# Patient Record
Sex: Female | Born: 1985
Health system: Southern US, Community
[De-identification: ages and names within clinical notes are randomized; demographics above are authoritative.]

## PROBLEM LIST (undated history)

## (undated) DIAGNOSIS — F329 Major depressive disorder, single episode, unspecified: Secondary | ICD-10-CM

## (undated) DIAGNOSIS — F32A Depression, unspecified: Secondary | ICD-10-CM

## (undated) DIAGNOSIS — M549 Dorsalgia, unspecified: Secondary | ICD-10-CM

## (undated) HISTORY — PX: APPENDECTOMY: SHX54

---
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
## 2005-01-29 ENCOUNTER — Ambulatory Visit: Payer: Self-pay | Admitting: Family Medicine

## 2005-03-02 ENCOUNTER — Ambulatory Visit: Payer: Self-pay | Admitting: Family Medicine

## 2005-03-08 ENCOUNTER — Ambulatory Visit: Payer: Self-pay | Admitting: Family Medicine

## 2005-03-16 ENCOUNTER — Ambulatory Visit: Payer: Self-pay | Admitting: Family Medicine

## 2005-06-22 ENCOUNTER — Ambulatory Visit: Payer: Self-pay | Admitting: Family Medicine

## 2006-11-25 ENCOUNTER — Emergency Department (HOSPITAL_COMMUNITY): Admission: EM | Admit: 2006-11-25 | Discharge: 2006-11-25 | Payer: Self-pay | Admitting: Emergency Medicine

## 2007-08-17 ENCOUNTER — Emergency Department (HOSPITAL_COMMUNITY): Admission: EM | Admit: 2007-08-17 | Discharge: 2007-08-17 | Payer: Self-pay | Admitting: Emergency Medicine

## 2008-11-24 ENCOUNTER — Inpatient Hospital Stay (HOSPITAL_COMMUNITY): Admission: AD | Admit: 2008-11-24 | Discharge: 2008-11-28 | Payer: Self-pay | Admitting: Obstetrics & Gynecology

## 2009-11-04 ENCOUNTER — Encounter (INDEPENDENT_AMBULATORY_CARE_PROVIDER_SITE_OTHER): Payer: Self-pay | Admitting: *Deleted

## 2010-10-26 NOTE — Letter (Signed)
Summary: New Patient letter  [facility]  [address] [place], [place] [address]   Phone: [phone]  Fax: [phone]       11/04/2009 MRN: [id number]  Ruth Cannon [address] [id number] [place], [place]  [address]  Dear Ruth Cannon,  Welcome to the Gastroenterology Division at [facility].    You are scheduled to see Dr.  Arlyce Dice on 12-02-09 at 3:30pm on the 3rd floor at [facility], [address]  We ask that you try to arrive at our office 15 minutes prior to your appointment time to allow for check-in.  We would like you to complete the enclosed self-administered evaluation form prior to your visit and bring it with you on the day of your appointment.  We will review it with you.  Also, please bring a complete list of all your medications or, if you prefer, bring the medication bottles and we will list them.  Please bring your insurance card so that we may make a copy of it.  If your insurance requires a referral to see a specialist, please bring your referral form from your primary care physician.  Co-payments are due at the time of your visit and may be paid by cash, check or credit card.     Your office visit will consist of a consult with your physician (includes a physical exam), any laboratory testing he/she may order, scheduling of any necessary diagnostic testing (e.g. x-ray, ultrasound, CT-scan), and scheduling of a procedure (e.g. Endoscopy, Colonoscopy) if required.  Please allow enough time on your schedule to allow for any/all of these possibilities.    If you cannot keep your appointment, please call [phone] to cancel or reschedule prior to your appointment date.  This allows [place] the opportunity to schedule an appointment for another patient in need of care.  If you do not cancel or reschedule by 5 p.m. the business day prior to your appointment date, you will be charged a $50.00 late cancellation/no-show fee.    Thank you for  choosing [facility] for your medical needs.  We appreciate the opportunity to care for you.  Please visit [place] at our website  to learn more about our practice.                     Sincerely,                                                             The Gastroenterology Division

## 2011-01-04 LAB — CBC
HCT: 30 % — ABNORMAL LOW (ref 36.0–46.0)
HCT: 40.6 % (ref 36.0–46.0)
Hemoglobin: 10.3 g/dL — ABNORMAL LOW (ref 12.0–15.0)
MCHC: 34.2 g/dL (ref 30.0–36.0)
MCHC: 34.2 g/dL (ref 30.0–36.0)
MCV: 97.8 fL (ref 78.0–100.0)
Platelets: 139 10*3/uL — ABNORMAL LOW (ref 150–400)
RBC: 3.07 MIL/uL — ABNORMAL LOW (ref 3.87–5.11)
RDW: 14 % (ref 11.5–15.5)
RDW: 14 % (ref 11.5–15.5)
WBC: 11.9 10*3/uL — ABNORMAL HIGH (ref 4.0–10.5)

## 2011-01-04 LAB — RPR: RPR Ser Ql: NONREACTIVE

## 2011-02-06 NOTE — Op Note (Signed)
NAME:  Ruth Cannon, Ruth Cannon NO.:  [id number]   MEDICAL RECORD NO.:  [id number]          PATIENT TYPE:  INP   LOCATION:  9167                          FACILITY:  [facility]   PHYSICIAN:  Gerrit Friends. Aldona Bar, M.D.   DATE OF BIRTH:  12-Mar-1986   DATE OF PROCEDURE:  DATE OF DISCHARGE:                               OPERATIVE REPORT   PREOPERATIVE DIAGNOSES:  Term pregnancy, active labor, arrest of second  stage of labor.   POSTOPERATIVE DIAGNOSES:  Term pregnancy, active labor, arrest of second  stage of labor, delivery of 10 pounds 2 ounces female infant, Apgars 9 and  9.   PROCEDURE:  Primary low-transverse cesarean section.   SURGEON:  Gerrit Friends. Aldona Bar, MD   ANESTHESIA:  Epidural.   HISTORY:  This 25 year old primigravida was admitted at 39-[redacted] weeks  gestation in labor after a relatively uncomplicated pregnancy.  She  progressed to full dilatation with the vertex remained at +1 station at  best.  After an hour and 20 minutes of pushing, the vertex was still at  +1 station and there was some holding.  The patient had an ultrasound at  37-38 weeks' gestation, which revealed the baby weighing then at least  3800-3900 grams placing at greater than 90th percentile.  She also  measured 42 cm when examined early this morning.   Because of concerns about the size of the baby associated with the  arrest of second stage of the labor, decision was made to proceed with  primary low-transverse cesarean section for delivery.   PROCEDURE IN DETAIL:  The patient was taken to the operating room where  after the augmentation of her epidural she was placed in the supine  position, slightly tilted left and prepped and draped in usual fashion.  A Foley catheter had been inserted prior to her arrival in the operating  room and indeed the urine was noted to be slightly bloody prior to  beginning the operative procedure.   Once good anesthetic levels were documented, procedure was begun.  A  Pfannenstiel incision was made with minimal difficulty, dissected down  sharply to and through the fascia in low transverse fashion with  hemostasis created each layer.  The fascia was incised in low transverse  fashion, subfascial space was developed, muscles separated in midline,  peritoneum identified and entered appropriate with care taken to avoid  the bowel superiorly and the bladder inferiorly.  At this time, the  vesicouterine peritoneum was incised in low transverse fashion and  pushed off the lower uterine segment with ease.  Sharp incision in  uterus low-transverse fashion was then made with Metzenbaum scissors and  extended laterally with fingers.  Thereafter from a vertex position a  viable female infant, which cried spontaneously once was delivered.  Apgars were 9 and 9 - baby was crying before completely delivered.   Cord was clamped and cut.  The infant was passed off the awaiting team  and subsequently taken to nursery in good condition.   Placenta was delivered intact and the patient was a cord blood donor.  The placenta was passed off and  cord bloods will be collected by the  cord blood team.   At this time, the uterus was exteriorized, rendered free of any  remaining products of conception.  Good uterine contractility was  afforded with slowly given intravenous Pitocin and manual stimulation.  At this time, closure of the uterine incision was carried out using a  single layer of #1 Vicryl in a running-locking fashion, and this was  oversewn with several figure-of-eight #1 Vicryls.   Hemostasis was adequate at this point.  Uterus was well contracted.  Tubes and ovaries appeared normal.  After the abdomen lavaged of all  free blood and clot, the uterus was replaced into the abdominal cavity.  At this time, all counts were noted to be correct and no foreign bodies  were noted to be remaining in the abdominal cavity.  Closure of the  abdomen at this time was carried out  in layers.  The abdominal  peritoneum was closed with 0 Vicryl in a running fashion and muscle  secured with same.  Assured of good subfascial hemostasis.  The fascia  was then reapproximated using 0 Vicryl from angle to midline  bilaterally.  Subcutaneous tissue was __________ hemostatic and staples  were used to close the skin.  Sterile pressure dressing was applied.  At  this time, the patient was transport to recovery room in satisfactory  condition having tolerated the procedure well.  Estimated blood loss 500  mL.  All counts correct x2.  At the conclusion of procedure, both mother  and baby were doing well in respective to recovery areas.   In summary, this patient underwent a primary low-transverse cesarean  section because of arrest disorder in second stage of labor secondary to  what was suspected to be a large baby and indeed the baby ended up  weighing 10 pounds 2 ounces.      Gerrit Friends. Aldona Bar, M.D.  Electronically Signed     RMW/MEDQ  D:  11/25/2008  T:  11/25/2008  Job:  [id number]

## 2011-02-09 NOTE — Discharge Summary (Signed)
NAME:  Ruth Cannon, Ruth Cannon NO.:  [id number]   MEDICAL RECORD NO.:  [id number]          PATIENT TYPE:  INP   LOCATION:  9111                          FACILITY:  [facility]   PHYSICIAN:  Randye Lobo, M.D.   DATE OF BIRTH:  Mar 01, 1986   DATE OF ADMISSION:  11/24/2008  DATE OF DISCHARGE:  11/28/2008                               DISCHARGE SUMMARY   FINAL DIAGNOSES:  Intrauterine pregnancy at term, active labor, arrest  of second stage of labor, macrosomia.   PROCEDURE:  Primary low transverse cesarean section.   SURGEON:  Gerrit Friends. Aldona Bar, MD   COMPLICATIONS:  None.   This 25 year old, G1, P0, presents at 39+ weeks' gestation in active  labor.  The patient's antepartum course up to this point had been  relatively uncomplicated.  She does have hypothyroidism, but was  monitored throughout the pregnancy with thyroid function tests.  The  patient did have a negative group B strep culture obtained in our office  at 36 weeks.  The patient presents now in active labor.  She progressed  to complete dilation.  The vertex was about a +1 station.  After over an  hour and 20 minutes of pushing, the vertex had not changed past a +1  station.  There was some questionable evidence of macrosomia, and  therefore a decision was made to proceed with a cesarean section  secondary to this arrest at a second stage labor.  The patient was taken  to the operating room on November 24, 2008, by Dr. Annamaria Helling where a  primary low transverse cesarean section was performed with the delivery  of a 10-pound 2-ounce female infant with Apgars of 9 and 9.  Delivery went  without complications.  The patient's postoperative course was benign  without any significant fevers.  She did want her little boy circumcised  prior to discharge which did occur.  The patient was felt ready for  discharge on postoperative day #3.  She was sent home on a regular diet,  told to decrease activities, told to continue her  prenatal vitamins, was  given Percocet to take one to two every 4-6 hours as needed for pain,  and told she could use ibuprofen up to 600 mg every 6 hours as needed  for pain.  She was to follow up in our office in 4 weeks.  Instructions  and precautions were reviewed with the patient.   LABORATORY DATA ON DISCHARGE:  The patient had a hemoglobin of 10.3,  white cell count of 11.9, and platelets of 117,000.      Leilani Able, P.A.-C.      Randye Lobo, M.D.  Electronically Signed    MB/MEDQ  D:  12/15/2008  T:  12/16/2008  Job:  [id number]

## 2012-01-02 ENCOUNTER — Encounter (HOSPITAL_COMMUNITY): Payer: Self-pay | Admitting: *Deleted

## 2012-01-02 ENCOUNTER — Inpatient Hospital Stay (HOSPITAL_COMMUNITY)
Admission: EM | Admit: 2012-01-02 | Discharge: 2012-01-06 | DRG: 917 | Disposition: A | Discharge status: Other Acute Inpatient Hospital | Payer: Medicaid Other | Attending: Pulmonary Disease | Admitting: Pulmonary Disease

## 2012-01-02 ENCOUNTER — Emergency Department (HOSPITAL_COMMUNITY): Payer: Medicaid Other

## 2012-01-02 DIAGNOSIS — T424X4A Poisoning by benzodiazepines, undetermined, initial encounter: Secondary | ICD-10-CM | POA: Diagnosis present

## 2012-01-02 DIAGNOSIS — T394X2A Poisoning by antirheumatics, not elsewhere classified, intentional self-harm, initial encounter: Secondary | ICD-10-CM | POA: Diagnosis present

## 2012-01-02 DIAGNOSIS — R4182 Altered mental status, unspecified: Secondary | ICD-10-CM | POA: Diagnosis present

## 2012-01-02 DIAGNOSIS — F1110 Opioid abuse, uncomplicated: Secondary | ICD-10-CM | POA: Diagnosis present

## 2012-01-02 DIAGNOSIS — F1020 Alcohol dependence, uncomplicated: Secondary | ICD-10-CM | POA: Diagnosis present

## 2012-01-02 DIAGNOSIS — F1310 Sedative, hypnotic or anxiolytic abuse, uncomplicated: Secondary | ICD-10-CM | POA: Diagnosis present

## 2012-01-02 DIAGNOSIS — T43502A Poisoning by unspecified antipsychotics and neuroleptics, intentional self-harm, initial encounter: Secondary | ICD-10-CM | POA: Diagnosis present

## 2012-01-02 DIAGNOSIS — T1491XA Suicide attempt, initial encounter: Secondary | ICD-10-CM

## 2012-01-02 DIAGNOSIS — J9601 Acute respiratory failure with hypoxia: Secondary | ICD-10-CM | POA: Diagnosis present

## 2012-01-02 DIAGNOSIS — E039 Hypothyroidism, unspecified: Secondary | ICD-10-CM | POA: Diagnosis present

## 2012-01-02 DIAGNOSIS — J9600 Acute respiratory failure, unspecified whether with hypoxia or hypercapnia: Secondary | ICD-10-CM | POA: Diagnosis present

## 2012-01-02 DIAGNOSIS — F17200 Nicotine dependence, unspecified, uncomplicated: Secondary | ICD-10-CM | POA: Diagnosis present

## 2012-01-02 DIAGNOSIS — T438X2A Poisoning by other psychotropic drugs, intentional self-harm, initial encounter: Secondary | ICD-10-CM | POA: Diagnosis present

## 2012-01-02 DIAGNOSIS — E876 Hypokalemia: Secondary | ICD-10-CM | POA: Diagnosis not present

## 2012-01-02 DIAGNOSIS — T400X1A Poisoning by opium, accidental (unintentional), initial encounter: Secondary | ICD-10-CM | POA: Diagnosis present

## 2012-01-02 DIAGNOSIS — T43624A Poisoning by amphetamines, undetermined, initial encounter: Principal | ICD-10-CM | POA: Diagnosis present

## 2012-01-02 DIAGNOSIS — T50911A Poisoning by multiple unspecified drugs, medicaments and biological substances, accidental (unintentional), initial encounter: Secondary | ICD-10-CM

## 2012-01-02 DIAGNOSIS — T50901A Poisoning by unspecified drugs, medicaments and biological substances, accidental (unintentional), initial encounter: Secondary | ICD-10-CM

## 2012-01-02 HISTORY — DX: Major depressive disorder, single episode, unspecified: F32.9

## 2012-01-02 HISTORY — DX: Dorsalgia, unspecified: M54.9

## 2012-01-02 HISTORY — DX: Depression, unspecified: F32.A

## 2012-01-02 LAB — DIFFERENTIAL
Basophils Relative: 1 % (ref 0–1)
Eosinophils Absolute: 0 10*3/uL (ref 0.0–0.7)
Eosinophils Relative: 1 % (ref 0–5)
Monocytes Absolute: 0.4 10*3/uL (ref 0.1–1.0)

## 2012-01-02 LAB — BASIC METABOLIC PANEL
CO2: 28 mEq/L (ref 19–32)
GFR calc Af Amer: >90 mL/min (ref >90)
GFR calc non Af Amer: >90 mL/min (ref >90)

## 2012-01-02 LAB — CBC
MCHC: 33.4 g/dL (ref 30.0–36.0)
MCV: 99.5 fL (ref 78.0–100.0)
Platelets: 233 10*3/uL (ref 150–400)
RBC: 3.7 MIL/uL — ABNORMAL LOW (ref 3.87–5.11)

## 2012-01-02 LAB — BLOOD GAS, ARTERIAL
Bicarbonate: 22.4 mEq/L (ref 20.0–24.0)
MECHVT: 450 mL
O2 Saturation: 99.3 %
PEEP: 5 cmH2O
Patient temperature: 37
RATE: 16 resp/min

## 2012-01-02 LAB — SALICYLATE LEVEL: Salicylate Lvl: <2 mg/dL — ABNORMAL LOW (ref 2.8–20.0)

## 2012-01-02 LAB — RAPID URINE DRUG SCREEN, HOSP PERFORMED
Amphetamines: POSITIVE — AB
Barbiturates: NOT DETECTED
Benzodiazepines: POSITIVE — AB
Opiates: POSITIVE — AB
Tetrahydrocannabinol: NOT DETECTED

## 2012-01-02 LAB — URINALYSIS, ROUTINE W REFLEX MICROSCOPIC
Ketones, ur: NEGATIVE mg/dL
Nitrite: NEGATIVE
Protein, ur: NEGATIVE mg/dL
pH: 6.5 (ref 5.0–8.0)

## 2012-01-02 LAB — PREGNANCY, URINE: Preg Test, Ur: NEGATIVE

## 2012-01-02 LAB — ACETAMINOPHEN LEVEL: Acetaminophen (Tylenol), Serum: <15 ug/mL (ref 10–30)

## 2012-01-02 LAB — ETHANOL: Alcohol, Ethyl (B): <11 mg/dL (ref 0–11)

## 2012-01-02 MED ORDER — LIDOCAINE HCL (CARDIAC) 20 MG/ML IV SOLN
INTRAVENOUS | Status: AC
  Filled 2012-01-02: qty 5

## 2012-01-02 MED ORDER — SUCCINYLCHOLINE CHLORIDE 20 MG/ML IJ SOLN
INTRAMUSCULAR | Status: AC
  Filled 2012-01-02: qty 1

## 2012-01-02 MED ORDER — PROPOFOL 10 MG/ML IV EMUL
INTRAVENOUS | Status: AC
  Filled 2012-01-02: qty 100

## 2012-01-02 MED ORDER — NALOXONE HCL 1 MG/ML IJ SOLN
INTRAMUSCULAR | Status: AC
  Administered 2012-01-02: 2 mg
  Filled 2012-01-02: qty 2

## 2012-01-02 MED ORDER — MIDAZOLAM HCL 5 MG/5ML IJ SOLN
INTRAMUSCULAR | Status: AC
  Administered 2012-01-02: 4 mg via INTRAVENOUS
  Filled 2012-01-02: qty 5

## 2012-01-02 MED ORDER — FENTANYL CITRATE 0.05 MG/ML IJ SOLN
100.0000 ug | Freq: Once | INTRAMUSCULAR | Status: AC
  Administered 2012-01-02: 100 ug via INTRAVENOUS
  Filled 2012-01-02: qty 2

## 2012-01-02 MED ORDER — SODIUM CHLORIDE 0.9 % IV BOLUS (SEPSIS)
1000.0000 mL | Freq: Once | INTRAVENOUS | Status: AC
  Administered 2012-01-02: 1000 mL via INTRAVENOUS

## 2012-01-02 MED ORDER — SODIUM CHLORIDE 0.9 % IV BOLUS (SEPSIS)
1000.0000 mL | Freq: Once | INTRAVENOUS | Status: AC
  Administered 2012-01-02 (×2): 1000 mL via INTRAVENOUS

## 2012-01-02 MED ORDER — PROPOFOL 10 MG/ML IV EMUL
5.0000 ug/kg/min | Freq: Once | INTRAVENOUS | Status: DC
  Administered 2012-01-02: 5 ug/kg/min via INTRAVENOUS

## 2012-01-02 MED ORDER — ROCURONIUM BROMIDE 50 MG/5ML IV SOLN
INTRAVENOUS | Status: AC
  Administered 2012-01-02: 70 mg via INTRAVENOUS
  Filled 2012-01-02: qty 2

## 2012-01-02 MED ORDER — NALOXONE HCL 0.4 MG/ML IJ SOLN: 2.0000 mg | Freq: Once | INTRAMUSCULAR | Status: DC

## 2012-01-02 MED ORDER — LORAZEPAM 2 MG/ML IJ SOLN
INTRAMUSCULAR | Status: AC
  Administered 2012-01-02: 2 mg via INTRAMUSCULAR
  Filled 2012-01-02: qty 1

## 2012-01-02 MED ORDER — MIDAZOLAM HCL 5 MG/ML IJ SOLN: 4.0000 mg | Freq: Once | INTRAMUSCULAR | Status: DC

## 2012-01-02 MED ORDER — ETOMIDATE 2 MG/ML IV SOLN
INTRAVENOUS | Status: AC
  Administered 2012-01-02: 20 mg
  Filled 2012-01-02: qty 20

## 2012-01-02 MED ORDER — HALOPERIDOL LACTATE 5 MG/ML IJ SOLN
INTRAMUSCULAR | Status: AC
  Filled 2012-01-02: qty 1

## 2012-01-02 MED ORDER — HALOPERIDOL LACTATE 5 MG/ML IJ SOLN
5.0000 mg | Freq: Once | INTRAMUSCULAR | Status: AC
  Administered 2012-01-02: 18:00:00 via INTRAMUSCULAR

## 2012-01-02 MED ORDER — LORAZEPAM 2 MG/ML IJ SOLN
2.0000 mg | Freq: Once | INTRAMUSCULAR | Status: AC
  Administered 2012-01-02: 2 mg via INTRAMUSCULAR

## 2012-01-02 MED ORDER — SODIUM CHLORIDE 0.9 % IV BOLUS (SEPSIS): 1000.0000 mL | Freq: Once | INTRAVENOUS | Status: DC
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
## 2012-01-02 NOTE — ED Notes (Signed)
Pink paroxentine and white paroxetine identified from rx bottle in purse.  Also identified trazodone x 1 and ibuprofen x 3, unknown strength of meds.

## 2012-01-02 NOTE — ED Notes (Signed)
ems also reports pt was unresponsive to ammonia inhalants.  Pt has 6 unidentifiable pills in rx bottle in purse.

## 2012-01-02 NOTE — ED Notes (Signed)
After receiving Narcan IV, pt became combative, uncooperative, speech unclear.  Needing multiple staff members to hold pt to bed.  edp notified and V.O. To given haldol and ativan IM.  No response to Haldol nor ativan.  Pt still combative, unable to calm pt.  Pt unclear of where she is at.  EDP gave order to intubate.  Pt sedated at this time.  4 pt restraints applied for behavior.
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
## 2012-01-02 NOTE — ED Notes (Signed)
Patient to CT.

## 2012-01-02 NOTE — ED Notes (Signed)
Chestine Spore, patient's mother. Cell: [phone] Home: [phone]

## 2012-01-02 NOTE — ED Notes (Signed)
Patient returned to room. RAS score -2.

## 2012-01-02 NOTE — ED Notes (Signed)
Removed 1 ring piercing from left lower lip and 1 ring piercing from tongue.  Placed in biohazard bag and put into pt's purse.  Pt also has wallet and cell phone in possession in purse.

## 2012-01-03 ENCOUNTER — Encounter (HOSPITAL_COMMUNITY): Payer: Self-pay

## 2012-01-03 DIAGNOSIS — T1491XA Suicide attempt, initial encounter: Secondary | ICD-10-CM | POA: Diagnosis present

## 2012-01-03 DIAGNOSIS — T50911A Poisoning by multiple unspecified drugs, medicaments and biological substances, accidental (unintentional), initial encounter: Secondary | ICD-10-CM | POA: Diagnosis present

## 2012-01-03 DIAGNOSIS — J9601 Acute respiratory failure with hypoxia: Secondary | ICD-10-CM | POA: Diagnosis present

## 2012-01-03 DIAGNOSIS — R4182 Altered mental status, unspecified: Secondary | ICD-10-CM | POA: Diagnosis present

## 2012-01-03 DIAGNOSIS — E039 Hypothyroidism, unspecified: Secondary | ICD-10-CM

## 2012-01-03 LAB — BLOOD GAS, ARTERIAL
Drawn by: 32099
Mode: POSITIVE
PEEP: 5 cmH2O
Patient temperature: 98.6
Pressure support: 5 cmH2O
pCO2 arterial: 39.4 mmHg (ref 35.0–45.0)
pH, Arterial: 7.402 — ABNORMAL HIGH (ref 7.350–7.400)

## 2012-01-03 LAB — COMPREHENSIVE METABOLIC PANEL
ALT: 16 U/L (ref 0–35)
AST: 29 U/L (ref 0–37)
Alkaline Phosphatase: 45 U/L (ref 39–117)
Calcium: 7.6 mg/dL — ABNORMAL LOW (ref 8.4–10.5)
Potassium: 2.8 mEq/L — ABNORMAL LOW (ref 3.5–5.1)
Sodium: 140 mEq/L (ref 135–145)
Total Protein: 6 g/dL (ref 6.0–8.3)

## 2012-01-03 LAB — URINE CULTURE: Colony Count: NO GROWTH

## 2012-01-03 LAB — CBC
HCT: 33.9 % — ABNORMAL LOW (ref 36.0–46.0)
Hemoglobin: 11.4 g/dL — ABNORMAL LOW (ref 12.0–15.0)
MCH: 33.4 pg (ref 26.0–34.0)
MCV: 99.4 fL (ref 78.0–100.0)
Platelets: 205 10*3/uL (ref 150–400)
RBC: 3.41 MIL/uL — ABNORMAL LOW (ref 3.87–5.11)
WBC: 10.7 10*3/uL — ABNORMAL HIGH (ref 4.0–10.5)

## 2012-01-03 LAB — LACTIC ACID, PLASMA: Lactic Acid, Venous: 2.1 mmol/L (ref 0.5–2.2)

## 2012-01-03 LAB — HIV ANTIBODY (ROUTINE TESTING W REFLEX): HIV: NONREACTIVE

## 2012-01-03 MED ORDER — HEPARIN SODIUM (PORCINE) 5000 UNIT/ML IJ SOLN
5000.0000 [IU] | Freq: Three times a day (TID) | INTRAMUSCULAR | Status: DC
  Administered 2012-01-03 – 2012-01-04 (×3): 5000 [IU] via SUBCUTANEOUS
  Filled 2012-01-03 (×7): qty 1

## 2012-01-03 MED ORDER — SODIUM CHLORIDE 0.9 % IJ SOLN
3.0000 mL | Freq: Two times a day (BID) | INTRAMUSCULAR | Status: DC
  Administered 2012-01-03 – 2012-01-05 (×6): 3 mL via INTRAVENOUS

## 2012-01-03 MED ORDER — PANTOPRAZOLE SODIUM 40 MG IV SOLR
40.0000 mg | Freq: Every day | INTRAVENOUS | Status: DC
  Administered 2012-01-03: 40 mg via INTRAVENOUS
  Filled 2012-01-03 (×3): qty 40

## 2012-01-03 MED ORDER — POTASSIUM CHLORIDE 10 MEQ/100ML IV SOLN
10.0000 meq | INTRAVENOUS | Status: AC
  Administered 2012-01-03 (×3): 10 meq via INTRAVENOUS
  Filled 2012-01-03 (×3): qty 100

## 2012-01-03 MED ORDER — POTASSIUM CHLORIDE 10 MEQ/100ML IV SOLN
10.0000 meq | Freq: Once | INTRAVENOUS | Status: AC
  Administered 2012-01-03: 10 meq via INTRAVENOUS
  Filled 2012-01-03: qty 100

## 2012-01-03 MED ORDER — PROPOFOL 10 MG/ML IV EMUL
INTRAVENOUS | Status: AC
  Filled 2012-01-03: qty 100

## 2012-01-03 MED ORDER — MUPIROCIN 2 % EX OINT
1.0000 "application " | TOPICAL_OINTMENT | Freq: Two times a day (BID) | CUTANEOUS | Status: DC
  Administered 2012-01-03 – 2012-01-06 (×7): 1 via NASAL
  Filled 2012-01-03: qty 22

## 2012-01-03 MED ORDER — POTASSIUM CHLORIDE 20 MEQ/15ML (10%) PO LIQD
40.0000 meq | Freq: Once | ORAL | Status: AC
  Administered 2012-01-03: 40 meq
  Filled 2012-01-03 (×2): qty 30

## 2012-01-03 MED ORDER — SODIUM CHLORIDE 0.9 % IV SOLN: 250.0000 mL | INTRAVENOUS | Status: DC | PRN

## 2012-01-03 MED ORDER — SODIUM CHLORIDE 0.9 % IJ SOLN: 3.0000 mL | INTRAMUSCULAR | Status: DC | PRN

## 2012-01-03 MED ORDER — CHLORHEXIDINE GLUCONATE CLOTH 2 % EX PADS
6.0000 | MEDICATED_PAD | Freq: Every day | CUTANEOUS | Status: DC
  Administered 2012-01-03 – 2012-01-04 (×2): 6 via TOPICAL
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
## 2012-01-03 NOTE — Progress Notes (Signed)
Patient placed on cpap 5/5 30% per MD. Dr Kendrick Fries wanted to leave patient on cpap for the rest of the night for poss. Extubation in the morning. RT will continue to monitor.
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
## 2012-01-03 NOTE — Procedures (Signed)
Extubation Procedure Note  Patient Details:   Name: Ruth Cannon DOB: 09-Jan-1986 MRN: [id number]   Airway Documentation:  Patient extubated per MD order and placed on 3L [place], sats 100%.  Pt has a strong cough and is able to vocalize post extubation.  RT will monitor.         Evaluation  O2 sats: stable throughout Complications: No apparent complications Patient did tolerate procedure well. Bilateral Breath Sounds: Clear;Diminished Suctioning: Airway Yes  [place] Lions 01/03/2012, 9:48 AM

## 2012-01-04 ENCOUNTER — Inpatient Hospital Stay (HOSPITAL_COMMUNITY): Payer: Medicaid Other

## 2012-01-04 DIAGNOSIS — T394X2A Poisoning by antirheumatics, not elsewhere classified, intentional self-harm, initial encounter: Secondary | ICD-10-CM

## 2012-01-04 DIAGNOSIS — R4182 Altered mental status, unspecified: Secondary | ICD-10-CM

## 2012-01-04 DIAGNOSIS — T43624A Poisoning by amphetamines, undetermined, initial encounter: Principal | ICD-10-CM

## 2012-01-04 DIAGNOSIS — T424X4A Poisoning by benzodiazepines, undetermined, initial encounter: Secondary | ICD-10-CM

## 2012-01-04 DIAGNOSIS — F1020 Alcohol dependence, uncomplicated: Secondary | ICD-10-CM | POA: Diagnosis present

## 2012-01-04 DIAGNOSIS — T400X1A Poisoning by opium, accidental (unintentional), initial encounter: Secondary | ICD-10-CM

## 2012-01-04 DIAGNOSIS — J9600 Acute respiratory failure, unspecified whether with hypoxia or hypercapnia: Secondary | ICD-10-CM

## 2012-01-04 DIAGNOSIS — T50901A Poisoning by unspecified drugs, medicaments and biological substances, accidental (unintentional), initial encounter: Secondary | ICD-10-CM

## 2012-01-04 LAB — COMPREHENSIVE METABOLIC PANEL
ALT: 15 U/L (ref 0–35)
AST: 26 U/L (ref 0–37)
Calcium: 8.7 mg/dL (ref 8.4–10.5)
Creatinine, Ser: 0.58 mg/dL (ref 0.50–1.10)
GFR calc Af Amer: >90 mL/min (ref >90)
GFR calc non Af Amer: >90 mL/min (ref >90)
Sodium: 139 mEq/L (ref 135–145)
Total Protein: 6.9 g/dL (ref 6.0–8.3)

## 2012-01-04 LAB — CBC
HCT: 36.5 % (ref 36.0–46.0)
Hemoglobin: 12.5 g/dL (ref 12.0–15.0)
MCH: 33.9 pg (ref 26.0–34.0)
MCHC: 34.2 g/dL (ref 30.0–36.0)
RBC: 3.69 MIL/uL — ABNORMAL LOW (ref 3.87–5.11)

## 2012-01-04 LAB — DIFFERENTIAL
Basophils Relative: 1 % (ref 0–1)
Lymphs Abs: 2.1 10*3/uL (ref 0.7–4.0)
Monocytes Absolute: 0.7 10*3/uL (ref 0.1–1.0)
Monocytes Relative: 9 % (ref 3–12)
Neutro Abs: 5.5 10*3/uL (ref 1.7–7.7)
Neutrophils Relative %: 64 % (ref 43–77)

## 2012-01-04 MED ORDER — POTASSIUM CHLORIDE CRYS ER 20 MEQ PO TBCR
40.0000 meq | EXTENDED_RELEASE_TABLET | ORAL | Status: AC
  Administered 2012-01-04 (×2): 40 meq via ORAL
  Filled 2012-01-04 (×2): qty 2
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
## 2012-01-04 NOTE — Progress Notes (Signed)
Pt disclosed to Dr Tyson Alias and myself that she took an unknown amount of amitriptyline and hydrocodone. Because of the intake of amitriptyline Dr Tyson Alias would like an additional EKG. EKG yesterday was WDL.

## 2012-01-04 NOTE — Progress Notes (Signed)
Education provided to pt about MRSA. Printout given.
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
## 2012-01-04 NOTE — Progress Notes (Signed)
Clinical Social Work:  Archivist  Followed up with psych MD as well as family and patient.  Patient still not agreeable to admission into behavioral health hospital at time of discharge, thus was placed under commitment for patient safety.  Once medically stable will refer patient for admission to [facility].  Will follow up.  Papers are in patient chart for GDP to serve and sign.  Please do not throw away.  Ashley Jacobs, MSW LCSW [phone]
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
## 2012-01-05 DIAGNOSIS — X838XXA Intentional self-harm by other specified means, initial encounter: Secondary | ICD-10-CM

## 2012-01-05 LAB — BASIC METABOLIC PANEL
BUN: 11 mg/dL (ref 6–23)
Chloride: 104 mEq/L (ref 96–112)
Creatinine, Ser: 0.73 mg/dL (ref 0.50–1.10)
Glucose, Bld: 103 mg/dL — ABNORMAL HIGH (ref 70–99)
Potassium: 4.1 mEq/L (ref 3.5–5.1)

## 2012-01-05 LAB — GLUCOSE, CAPILLARY: Glucose-Capillary: 161 mg/dL — ABNORMAL HIGH (ref 70–99)

## 2012-01-05 MED ORDER — CLONIDINE HCL 0.2 MG PO TABS
0.2000 mg | ORAL_TABLET | Freq: Three times a day (TID) | ORAL | Status: DC | PRN
  Administered 2012-01-05 – 2012-01-06 (×4): 0.2 mg via ORAL
  Filled 2012-01-05 (×5): qty 1

## 2012-01-05 MED ORDER — TRAMADOL HCL 50 MG PO TABS
50.0000 mg | ORAL_TABLET | Freq: Two times a day (BID) | ORAL | Status: DC | PRN
  Administered 2012-01-05 – 2012-01-06 (×2): 50 mg via ORAL
  Filled 2012-01-05 (×2): qty 1

## 2012-01-05 MED ORDER — CLONIDINE HCL 0.2 MG PO TABS: 0.2000 mg | ORAL_TABLET | Freq: Three times a day (TID) | ORAL | Status: DC | PRN

## 2012-01-05 MED ORDER — BUPROPION HCL 75 MG PO TABS
75.0000 mg | ORAL_TABLET | Freq: Two times a day (BID) | ORAL | Status: DC
  Administered 2012-01-05 – 2012-01-06 (×3): 75 mg via ORAL
  Filled 2012-01-05 (×4): qty 1

## 2012-01-05 MED ORDER — DIVALPROEX SODIUM 250 MG PO DR TAB: 250.0000 mg | DELAYED_RELEASE_TABLET | Freq: Two times a day (BID) | ORAL | Status: DC

## 2012-01-05 MED ORDER — LOPERAMIDE HCL 2 MG PO CAPS
2.0000 mg | ORAL_CAPSULE | ORAL | Status: DC | PRN
  Administered 2012-01-06: 2 mg via ORAL
  Filled 2012-01-05: qty 1

## 2012-01-05 MED ORDER — BUPROPION HCL 75 MG PO TABS: 75.0000 mg | ORAL_TABLET | Freq: Two times a day (BID) | ORAL | Status: DC

## 2012-01-05 MED ORDER — DIVALPROEX SODIUM 250 MG PO DR TAB
250.0000 mg | DELAYED_RELEASE_TABLET | Freq: Two times a day (BID) | ORAL | Status: DC
  Administered 2012-01-05 – 2012-01-06 (×3): 250 mg via ORAL
  Filled 2012-01-05 (×4): qty 1

## 2012-01-05 NOTE — Plan of Care (Signed)
Transferred to 5000
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
## 2012-01-06 ENCOUNTER — Encounter (HOSPITAL_COMMUNITY): Payer: Self-pay | Admitting: *Deleted

## 2012-01-06 ENCOUNTER — Inpatient Hospital Stay (HOSPITAL_COMMUNITY)
Admission: AD | Admit: 2012-01-06 | Discharge: 2012-01-08 | DRG: 897 | Disposition: A | Discharge status: Home / Self Care | Payer: 59 | Source: Ambulatory Visit | Attending: Psychiatry | Admitting: Psychiatry

## 2012-01-06 ENCOUNTER — Telehealth (HOSPITAL_COMMUNITY): Payer: Self-pay | Admitting: *Deleted

## 2012-01-06 DIAGNOSIS — F602 Antisocial personality disorder: Secondary | ICD-10-CM | POA: Diagnosis present

## 2012-01-06 DIAGNOSIS — T50911A Poisoning by multiple unspecified drugs, medicaments and biological substances, accidental (unintentional), initial encounter: Secondary | ICD-10-CM

## 2012-01-06 DIAGNOSIS — F1020 Alcohol dependence, uncomplicated: Secondary | ICD-10-CM

## 2012-01-06 DIAGNOSIS — E873 Alkalosis: Secondary | ICD-10-CM | POA: Diagnosis present

## 2012-01-06 DIAGNOSIS — J9601 Acute respiratory failure with hypoxia: Secondary | ICD-10-CM

## 2012-01-06 DIAGNOSIS — F1910 Other psychoactive substance abuse, uncomplicated: Secondary | ICD-10-CM | POA: Diagnosis present

## 2012-01-06 DIAGNOSIS — E039 Hypothyroidism, unspecified: Secondary | ICD-10-CM

## 2012-01-06 DIAGNOSIS — R4182 Altered mental status, unspecified: Secondary | ICD-10-CM

## 2012-01-06 DIAGNOSIS — F1110 Opioid abuse, uncomplicated: Secondary | ICD-10-CM | POA: Diagnosis present

## 2012-01-06 DIAGNOSIS — F1310 Sedative, hypnotic or anxiolytic abuse, uncomplicated: Principal | ICD-10-CM | POA: Diagnosis present

## 2012-01-06 DIAGNOSIS — M549 Dorsalgia, unspecified: Secondary | ICD-10-CM | POA: Diagnosis present

## 2012-01-06 DIAGNOSIS — F1010 Alcohol abuse, uncomplicated: Secondary | ICD-10-CM | POA: Diagnosis present

## 2012-01-06 DIAGNOSIS — T1491XA Suicide attempt, initial encounter: Secondary | ICD-10-CM

## 2012-01-06 MED ORDER — LOPERAMIDE HCL 2 MG PO CAPS: 2.0000 mg | ORAL_CAPSULE | ORAL | Status: DC | PRN

## 2012-01-06 MED ORDER — LORAZEPAM 1 MG PO TABS
1.0000 mg | ORAL_TABLET | ORAL | Status: DC | PRN
  Administered 2012-01-07 (×2): 1 mg via ORAL
  Filled 2012-01-06 (×2): qty 1

## 2012-01-06 MED ORDER — LORAZEPAM 1 MG PO TABS
ORAL_TABLET | ORAL | Status: AC
  Administered 2012-01-06: 20:00:00
  Filled 2012-01-06: qty 1

## 2012-01-06 MED ORDER — MAGNESIUM HYDROXIDE 400 MG/5ML PO SUSP: 30.0000 mL | Freq: Every day | ORAL | Status: DC | PRN

## 2012-01-06 MED ORDER — HYDROXYZINE HCL 25 MG PO TABS: 25.0000 mg | ORAL_TABLET | Freq: Every evening | ORAL | Status: DC | PRN

## 2012-01-06 MED ORDER — ALUM & MAG HYDROXIDE-SIMETH 200-200-20 MG/5ML PO SUSP: 30.0000 mL | ORAL | Status: DC | PRN

## 2012-01-06 MED ORDER — ACETAMINOPHEN 325 MG PO TABS
650.0000 mg | ORAL_TABLET | Freq: Four times a day (QID) | ORAL | Status: DC | PRN
  Administered 2012-01-07 – 2012-01-08 (×3): 650 mg via ORAL

## 2012-01-06 MED ORDER — MUPIROCIN 2 % EX OINT
1.0000 "application " | TOPICAL_OINTMENT | Freq: Two times a day (BID) | CUTANEOUS | Status: DC
  Administered 2012-01-07 – 2012-01-08 (×3): 1 via NASAL
  Filled 2012-01-06 (×2): qty 22

## 2012-01-06 MED ORDER — DIVALPROEX SODIUM ER 250 MG PO TB24
250.0000 mg | ORAL_TABLET | ORAL | Status: DC
  Administered 2012-01-07 (×2): 250 mg via ORAL
  Filled 2012-01-06 (×5): qty 1
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
## 2012-01-06 NOTE — Progress Notes (Signed)
CSW informed Bed available at [facility] (307-1).   Pt transporting via police who have been called.  RN informed. Milus Banister MSW,LCSW w/e Coverage [phone]

## 2012-01-06 NOTE — Plan of Care (Signed)
Problem: Consults Goal: Suicide Risk Patient Education (See Patient Education module for education specifics) Outcome: Progressing Pt denies suicidal ideation 8pm 01/05/12 Doren Custard
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
## 2012-01-07 DIAGNOSIS — E873 Alkalosis: Secondary | ICD-10-CM | POA: Clinically undetermined

## 2012-01-07 DIAGNOSIS — F602 Antisocial personality disorder: Secondary | ICD-10-CM

## 2012-01-07 LAB — CULTURE, BLOOD (ROUTINE X 2): Culture: NO GROWTH

## 2012-01-07 MED ORDER — ONDANSETRON 4 MG PO TBDP: 4.0000 mg | ORAL_TABLET | Freq: Four times a day (QID) | ORAL | Status: DC | PRN

## 2012-01-07 MED ORDER — CLONIDINE HCL 0.1 MG PO TABS
0.1000 mg | ORAL_TABLET | Freq: Every day | ORAL | Status: DC
  Filled 2012-01-07: qty 1

## 2012-01-07 MED ORDER — DULOXETINE HCL 20 MG PO CPEP
20.0000 mg | ORAL_CAPSULE | Freq: Two times a day (BID) | ORAL | Status: DC
  Administered 2012-01-07 – 2012-01-08 (×2): 20 mg via ORAL
  Filled 2012-01-07 (×3): qty 1
  Filled 2012-01-07: qty 28
  Filled 2012-01-07: qty 1
  Filled 2012-01-07: qty 28

## 2012-01-07 MED ORDER — HYDROXYZINE HCL 25 MG PO TABS
25.0000 mg | ORAL_TABLET | Freq: Four times a day (QID) | ORAL | Status: DC | PRN
  Administered 2012-01-08: 25 mg via ORAL
  Filled 2012-01-07: qty 1

## 2012-01-07 MED ORDER — CLONIDINE HCL 0.1 MG PO TABS: 0.1000 mg | ORAL_TABLET | Freq: Once | ORAL | Status: DC

## 2012-01-07 MED ORDER — TRAZODONE HCL 50 MG PO TABS
50.0000 mg | ORAL_TABLET | Freq: Every day | ORAL | Status: DC
  Administered 2012-01-07: 50 mg via ORAL
  Filled 2012-01-07 (×2): qty 1
  Filled 2012-01-07: qty 14

## 2012-01-07 MED ORDER — CLONIDINE HCL 0.1 MG PO TABS
0.1000 mg | ORAL_TABLET | Freq: Four times a day (QID) | ORAL | Status: AC
  Administered 2012-01-07 (×3): 0.1 mg via ORAL
  Filled 2012-01-07 (×3): qty 1

## 2012-01-07 MED ORDER — LOPERAMIDE HCL 2 MG PO CAPS: 2.0000 mg | ORAL_CAPSULE | ORAL | Status: DC | PRN

## 2012-01-07 MED ORDER — THIAMINE HCL 100 MG/ML IJ SOLN
100.0000 mg | Freq: Once | INTRAMUSCULAR | Status: AC
  Administered 2012-01-07: 100 mg via INTRAMUSCULAR

## 2012-01-07 MED ORDER — ADULT MULTIVITAMIN W/MINERALS CH
1.0000 | ORAL_TABLET | Freq: Every day | ORAL | Status: DC
  Administered 2012-01-07 – 2012-01-08 (×2): 1 via ORAL
  Filled 2012-01-07 (×4): qty 1

## 2012-01-07 MED ORDER — DICYCLOMINE HCL 20 MG PO TABS: 20.0000 mg | ORAL_TABLET | ORAL | Status: DC | PRN

## 2012-01-07 MED ORDER — CHLORDIAZEPOXIDE HCL 25 MG PO CAPS: 25.0000 mg | ORAL_CAPSULE | Freq: Four times a day (QID) | ORAL | Status: DC | PRN

## 2012-01-07 MED ORDER — NAPROXEN 500 MG PO TABS
500.0000 mg | ORAL_TABLET | Freq: Two times a day (BID) | ORAL | Status: DC | PRN
  Administered 2012-01-08: 500 mg via ORAL
  Filled 2012-01-07: qty 1

## 2012-01-07 MED ORDER — CLONIDINE HCL 0.1 MG PO TABS: 0.1000 mg | ORAL_TABLET | ORAL | Status: DC

## 2012-01-07 MED ORDER — METHOCARBAMOL 500 MG PO TABS
500.0000 mg | ORAL_TABLET | Freq: Three times a day (TID) | ORAL | Status: DC | PRN
  Administered 2012-01-08: 500 mg via ORAL
  Filled 2012-01-07: qty 1

## 2012-01-07 MED ORDER — CLONIDINE HCL 0.1 MG PO TABS: 0.1000 mg | ORAL_TABLET | Freq: Four times a day (QID) | ORAL | Status: DC

## 2012-01-07 MED ORDER — CLONIDINE HCL 0.1 MG PO TABS: 0.1000 mg | ORAL_TABLET | Freq: Every day | ORAL | Status: DC

## 2012-01-07 MED ORDER — CLONIDINE HCL 0.1 MG PO TABS
0.1000 mg | ORAL_TABLET | ORAL | Status: DC
  Administered 2012-01-08: 0.1 mg via ORAL
  Filled 2012-01-07: qty 1
  Filled 2012-01-07: qty 5
  Filled 2012-01-07 (×3): qty 1

## 2012-01-07 MED ORDER — VITAMIN B-1 100 MG PO TABS
100.0000 mg | ORAL_TABLET | Freq: Every day | ORAL | Status: DC
  Administered 2012-01-08: 100 mg via ORAL
  Filled 2012-01-07 (×3): qty 1

## 2012-01-07 NOTE — H&P (Signed)
Pt seen and evaluated upon admission with physician extender.  See full H&P/PA.  Completed Admission Suicide Risk Assessment.  See orders.  Pt agreeable with plan.  Discussed with team.    

## 2012-01-07 NOTE — Progress Notes (Signed)
[facility] Adult Inpatient Family/Significant Other Suicide Prevention Education  Suicide Prevention Education:  Education Completed; Chestine Spore at [phone] (cell) and [phone] (home) has been identified by the patient as the family member/significant other with whom the patient will be residing, and identified as the person(s) who will aid the patient in the event of a mental health crisis (suicidal ideations/suicide attempt).  With written consent from the patient, the family member/significant other has been provided the following suicide prevention education, prior to the and/or following the discharge of the patient.  The suicide prevention education provided includes the following:  Suicide risk factors  Suicide prevention and interventions  National Suicide Hotline telephone number  [facility] assessment telephone number  [facility] Emergency Assistance 911  [facility] and/or Residential Mobile Crisis Unit telephone number  Request made of family/significant other to:  Remove weapons (e.g., guns, rifles, knives), all items previously/currently identified as safety concern.    Remove drugs/medications (over-the-counter, prescriptions, illicit drugs), all items previously/currently identified as a safety concern.  The family member/significant other verbalizes understanding of the suicide prevention education information provided.  The family member/significant other agrees to remove the items of safety concern listed above.  Clide Dales 01/07/2012, 6:45 PM

## 2012-01-07 NOTE — Progress Notes (Signed)
Pt did not attend d/c planning group on this date.  SW met with pt individually at this time.  Pt was resting in the bed.  Pt states that she came to the hospital after taking too many pain and sleep meds.  Pt is ademant that she was not trying to kill herself but just wanted to sleep and be without pain.  Pt states that she tried throwing up after she realized she took too many.  Pt states that she lives in [place] which is in [place].  Pt states that she lives with her mother and her son stays with her sometimes.  Pt states that she is still grieving the lost of her father, who died last May 13, 2023.  Pt denies having substance abuse.  Pt states that she does not have a psychiatrist or therapist but would be open to a referral. SW will refer pt to the mental health agency in [facility].  Pt denies having depression, anxiety and SI.  No further needs voiced by pt at this time.    Reyes Ivan, LCSWA 01/07/2012  2:56 PM

## 2012-01-07 NOTE — Progress Notes (Signed)
Patient ID: Ruth Cannon, female   DOB: 05-20-1986, 26 y.o.   MRN: [id number] She has been up and about and to parts of the groups. Has some interaction with peers and staff. She denies having withdrawal symptoms.  She has chronic back pain but has not requested any PRN medication. Says that her roommate kept her awake and she has gone to bed to sleep.

## 2012-01-07 NOTE — Progress Notes (Signed)
Patient ID: Ruth Cannon, female   DOB: 1986/02/28, 26 y.o.   MRN: [id number] The patient is very irritable. She does not want to be in [facility]. States that she did not try to kill herself and is angry that she is here. Refused offer to attend group stating that she did not want to interact with, "those people". Refused offer of snack. Compliant with medication.

## 2012-01-07 NOTE — Progress Notes (Signed)
[facility] Group Notes:  (Counselor/Nursing/MHT/Case Management/Adjunct)  01/07/2012 4:51 PM   Type of Therapy:  Processing Group at 11:00 am  Participation Level:  Active  Participation Quality:  Sharing and attentive  Affect:  Depressed  Cognitive:  Alert and oriented  Insight:  Limited  Engagement in Group:  Good  Engagement in Therapy:  Limited  Modes of Intervention:  Exploration, Problem Solving and support  Summary of Progress/Problems: Patient shared that one of her biggest triggers will be her telephone and she plans to delete numbers that relate to using yet she has tried this before and once one person gets new number everybody has it.  Others in group offered suggestions such as "go without a cell for 3 months, do not answer any calls, identify numbers that are hazardous with DO NOT ANSWER":  [facility] Group Notes:  (Counselor/Nursing/MHT/Case Management/Adjunct)  01/07/2012 4:51 PM   Type of Therapy:  Counseling Group at 1:15 pm  Participation Level:  None  Participation Quality:  Asleep  Ronda Fairly, LCSWA 01/07/2012 4:51 PM

## 2012-01-07 NOTE — Progress Notes (Signed)
Pt. Has flat affect,depressed mood and reports that she is just feeling tired.  Denies SI/HI and denies A/V hallucinations and contracts for safety. Support given.
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
## 2012-01-08 DIAGNOSIS — F1910 Other psychoactive substance abuse, uncomplicated: Secondary | ICD-10-CM

## 2012-01-08 MED ORDER — DULOXETINE HCL 20 MG PO CPEP: 20.0000 mg | ORAL_CAPSULE | Freq: Two times a day (BID) | ORAL | Status: DC

## 2012-01-08 MED ORDER — TRAZODONE HCL 50 MG PO TABS: 50.0000 mg | ORAL_TABLET | Freq: Every day | ORAL | Status: DC

## 2012-01-08 MED ORDER — CLONIDINE HCL 0.1 MG PO TABS: ORAL_TABLET | ORAL | Status: DC

## 2012-01-08 MED ORDER — CLONIDINE HCL 0.1 MG PO TABS: ORAL_TABLET | ORAL | Status: AC

## 2012-01-08 MED ORDER — TRAZODONE HCL 50 MG PO TABS: 50.0000 mg | ORAL_TABLET | Freq: Every day | ORAL | Status: AC

## 2012-01-08 MED ORDER — DULOXETINE HCL 20 MG PO CPEP: 20.0000 mg | ORAL_CAPSULE | Freq: Two times a day (BID) | ORAL | Status: AC

## 2012-01-08 NOTE — Progress Notes (Signed)
[facility] Group Notes:  (Counselor/Nursing/MHT/Case Management/Adjunct)  01/08/2012 2:34 PM  Type of Therapy:  Group Therapy at 11:00AM  Participation Level:  Active  Participation Quality:  Attentive and Sharing  Affect:  Appropriate  Cognitive:  Oriented  Insight:  Limited  Engagement in Group:  Good  Engagement in Therapy:  Limited  Modes of Intervention:  Clarification, Socialization and Support  Summary of Progress/Problems:  Ruth Cannon was attentive to discussion by others in group regarding feelings about diagnosis of anxiety, or depression and/or substance abuse, dependency although she did not share. When asked what she has had to give up due to her back issues she shared that she misses playing with son and physical expercise.    Clide Dales 01/08/2012, 2:38 PM

## 2012-01-08 NOTE — Progress Notes (Signed)
Patient ID: Ruth Cannon, female   DOB: 08/19/1986, 26 y.o.   MRN: [id number] She was discharged home. Was picked up by mother. Pt. Voiced understanding of discharge instruction and of follow up.   She denies thoughts of SI, all belonging taken home with her.

## 2012-01-08 NOTE — Progress Notes (Signed)
Lying quietly in bed with eyes closed.  Safety maintained via Q 15 min safety checks. 
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
## 2012-01-08 NOTE — Progress Notes (Signed)
[facility] Case Management Discharge Plan:  Will you be returning to the same living situation after discharge: Yes,  return home At discharge, do you have transportation home?:Yes,  mother transporting home Do you have the ability to pay for your medications:Yes,  provided samples, access to meds  Interagency Information:     Release of information consent forms completed and in the chart;  Patient's signature needed at discharge.  Patient to Follow up at:  Follow-up Information    Follow up with Arna Medici on 01/10/2012. (Appointment scheduled at 7:45 AM Referral # [id number])    Contact information:   [address] [place], [place] [address] [phone]       Follow up with [facility] (Call to make an appointment for grief/loss counseling)    Contact information:   [address] [place], [place]  [phone]         Patient denies SI/HI:   Yes,  denies SI/HI    Safety Planning and Suicide Prevention discussed:  Yes,  discussed with pt  Barrier to discharge identified:No.  Summary and Recommendations: Pt attended discharge planning group and actively participated.  Pt presents with calm mood and affect.  Pt denies having depression and anxiety today.  Pt reports feeling stable to d/c today.  Pt states that her mom initially wanted her to go to rehab but has since agreed for pt to come home and do outpatient services.  Pt discussed using yoga and exercise as a coping skill for her pain.  No recommendations from SW.  No further needs voiced by pt.  Pt stable to discharge.     Carmina Miller 01/08/2012, 1:33 PM

## 2012-01-08 NOTE — Tx Team (Signed)
Interdisciplinary Treatment Plan Update (Adult)  Date:  01/08/2012  Time Reviewed:  9:50 AM   Progress in Treatment: Attending groups: Yes Participating in groups:  Yes Taking medication as prescribed: Yes Tolerating medication:  Yes Family/Significant othe contact made:  Yes, contact made with mother. Patient understands diagnosis:  Yes Discussing patient identified problems/goals with staff:  Yes Medical problems stabilized or resolved:  Yes Denies suicidal/homicidal ideation: Yes Issues/concerns per patient self-inventory:  None identified Other: N/A  New problem(s) identified: Mother was concerned about pt and thought she needed further inpt tx.  Mother is accepting pt to come home and do outpt instead.   Reason for Continuation of Hospitalization: Stable to d/c  Interventions implemented related to continuation of hospitalization: Stable to d/c  Additional comments: N/A  Estimated length of stay: D/C today  Discharge Plan: Pt will follow up with mental health in [facility] goal(s): N/A  Review of initial/current patient goals per problem list:    1.  Goal(s): Address substance use  Met:  Yes  Target date: by discharge  As evidenced by: completed detox protocol and referred to appropriate treatment  2.  Goal (s): Reduce depressive and anxiety symptoms  Met:  Yes  Target date: by discharge  As evidenced by: Reducing depression from a 10 to a 3 as reported by pt.  Pt denies having depression and anxiety today.   3.  Goal(s): Eliminate SI  Met:  Yes  Target date: by discharge  As evidenced by: Pt denies SI   Attendees: Patient:  Ruth Cannon 01/08/2012 9:57 AM   Family:     Physician:  01/08/2012 9:50 AM   Nursing: Waynetta Sandy, RN 01/08/2012 9:50 AM   Case Manager:  Reyes Ivan, LCSWA 01/08/2012  9:50 AM   Counselor:  Ronda Fairly, LCSWA 01/08/2012  9:50 AM   Other:  Richelle Ito, LCSW 01/08/2012 9:50 AM   Other:  Verne Spurr, PA 01/08/2012  9:57 AM   Other:     Other:      Scribe for Treatment Team:   Reyes Ivan 01/08/2012 9:50 AM

## 2012-01-08 NOTE — Progress Notes (Signed)
Patient was on telephone with her mother at beginning of treatment team meeting; counselor was able to speak with pt's mother briefly as had left message for mother to call concerning her concerns re patient.  Ms Ruth Cannon confirms that patient and she have spoken and patient is more invested in IOP than inpatient treatment. Mother's only concern is that patient will not follow through but mother also understands patient has to be invested in followup plan.  Clide Dales 01/08/2012 12:29 PM
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
## 2012-01-08 NOTE — [facility] Suicide Risk Assessment (Signed)
Suicide Risk Assessment  Discharge Assessment     Demographic factors:  Adolescent or young adult;Caucasian;Low socioeconomic status;Unemployed  Current Mental Status Per Nursing Assessment::   On Admission:   (denied SI & HI at admission, contracts for safety) At Discharge:  The patient is AO x 3.  She denies any SI/HI.  She also denies any auditory or visual hallucinations or delusional thinking as well as any significant depressive symptoms.  Current Mental Status Per Physician:  Diagnosis:  Axis I: Polysubstance Abuse - Alcohol, Opioids, Amphetamines.    The patient was seen today and reports the following:   Sleep: The patient reports to sleeping well last night  Appetite: The patient reports a good appetite.   Mild>(1-10) >Severe  Hopelessness (1-10): 0  Depression (1-10): 0  Anxiety (1-10): 0   Suicidal Ideation: The patient adamantly denies any suicidal ideations today.  Plan: No  Intent: No  Means: No   Homicidal Ideation: The patient adamantly denies any homicidal ideations today.  Plan: No  Intent: No.  Means: No   Eye Contact: Good.  General Appearance/Behavior: The patient was friendly and cooperative during the interview today and was in no apparent distress.  Motor Behavior: wnl.  Speech: Appropriate in rate and volume with no pressuring of speech today.  Mental Status: Alert and Oriented x 3  Level of Consciousness: Alert  Mood: Euthymic.  Affect: Bright and Full.  Anxiety: No anxiety reported today.  Thought Process: wnl.  Thought Content: The patient denied any auditory or visual hallucinations today. She also denied any delusional thinking.  Perception: wnl.  Judgment: Good.  Insight: Good.  Cognition: Orientated to person, place and time.   Loss Factors: Decrease in vocational status;Loss of significant relationship;Decline in physical health;Legal issues;Financial problems / change in socioeconomic status  Historical Factors: Anniversary of  important loss;Impulsivity  Risk Reduction Factors:   Good Family Support.  Good Insight into psychiatric issues.  Continued Clinical Symptoms:  Alcohol/Substance Abuse/Dependencies Chronic Pain Previous Psychiatric Diagnoses and Treatments Medical Diagnoses and Treatments/Surgeries  Discharge Diagnoses:  AXIS I:  Polysubstance Abuse - Alcohol, Opioids, Amphetamines.  AXIS II:  Deferred.  AXIS III:  1.  Chronic Back Pain.   2.  Recent Overdose. AXIS IV:  History of Substance Abuse.  Chronic Pain Issues.Marland Kitchen AXIS V:  GAF at time of admission approximately 35. GAF at time of discharge approximately 60.   Cognitive Features That Contribute To Risk:  None Noted.   The patient was seen today and reports to feeling much better and is ready for discharge.  The patient denies any significant feelings of sadness, anhedonia or depressed mood and denies any suicidal or homicidal ideations.  She also denies any auditory or visual hallucinations or delusional thinking.  In addition, the patient denies any symptoms of substance withdrawal. She states that she accidentally overdosed on her pain medications secondary to her chronic back pain.  Today she states she at no time was attempting to harm herself.  Suicide Risk:  Minimal: No identifiable suicidal ideation. Patients presenting with no risk factors but with morbid ruminations; may be classified as minimal risk based on the severity of the depressive symptoms   Plan Of Care/Follow-up recommendations:  Activity: As tolerated.  Diet: Regular Diet.  Other: Please abstain from any use of alcohol or illicit drugs. Please take all medications only as directed and keep all scheduled follow up appointments.   Ruth Cannon 01/08/2012, 12:15 PM

## 2012-01-11 NOTE — Progress Notes (Signed)
Patient Discharge Instructions:  Psychiatric Admission Assessment Note Provided,  01/11/2012 After Visit Summary (AVS) Provided,  01/11/2012 Face Sheet Provided, 01/11/2012 Faxed/Sent to the Next Level Care provider:  01/11/2012 Provided Suicide Risk Assessment - Discharge Assessment 01/11/2012  Faxed to [facility] @ [phone]  Heloise Purpura, Eduard Clos, 01/11/2012, 5:53 PM
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
## 2022-02-15 ENCOUNTER — Ambulatory Visit: Payer: Self-pay | Admitting: Family Medicine

## 2022-02-15 ENCOUNTER — Telehealth: Payer: Self-pay | Admitting: Family Medicine

## 2022-02-15 DIAGNOSIS — N939 Abnormal uterine and vaginal bleeding, unspecified: Secondary | ICD-10-CM

## 2022-02-15 NOTE — Progress Notes (Signed)
[place]   Needs in person eval with cycle changes regarding increased and heavy bleeding.  Message with detail sent on via this EV

## 2022-02-27 ENCOUNTER — Ambulatory Visit: Payer: Self-pay | Admitting: Family Medicine
